# Patient Record
Sex: Female | Born: 2000 | Race: Black or African American | Hispanic: No | Marital: Single | Smoking: Never smoker
Health system: Southern US, Community
[De-identification: ages and names within clinical notes are randomized; demographics above are authoritative.]

---
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
## 2019-07-17 IMAGING — CT CT ANGIO CHEST
2 of 6 series · 19 of 36 positions shown · IV contrast (iopamidol)
Comparison: None.

CLINICAL DATA: Chest pain positive D-dimer

EXAM:
CT ANGIOGRAPHY CHEST WITH CONTRAST
TECHNIQUE: Multidetector CT imaging of the chest was performed using the
standard protocol during bolus administration of intravenous
contrast. Multiplanar CT image reconstructions and MIPs were
obtained to evaluate the vascular anatomy.
CONTRAST:  100mL 8LX1VI-ST2 IOPAMIDOL (8LX1VI-ST2) INJECTION 76%

[Series 9: pe thins · axial · 0.77mm/px · z∈[+1130,+1336]mm · 18 of 329 slices shown]
[im 17/329  lung]
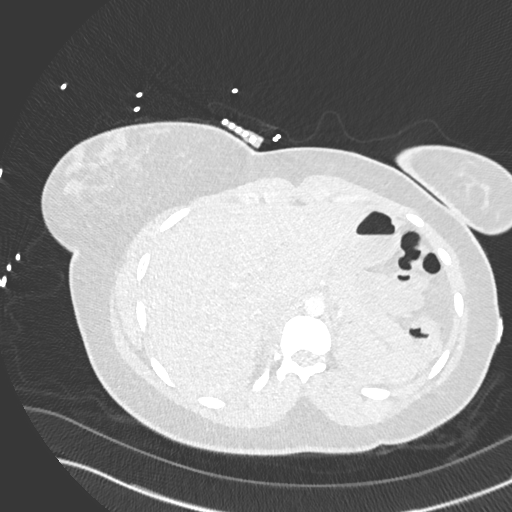
[im 33/329  mediastinal]
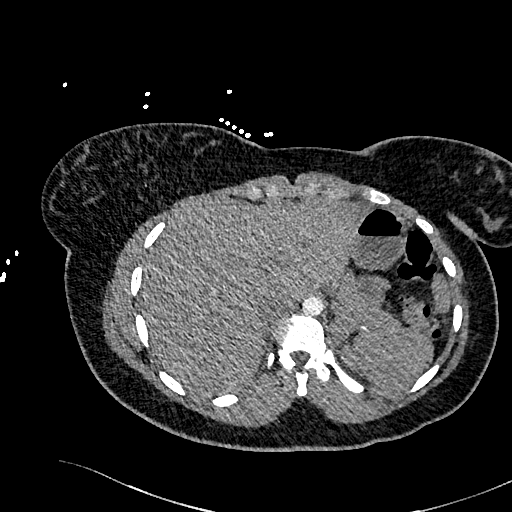
[im 50/329  lung]
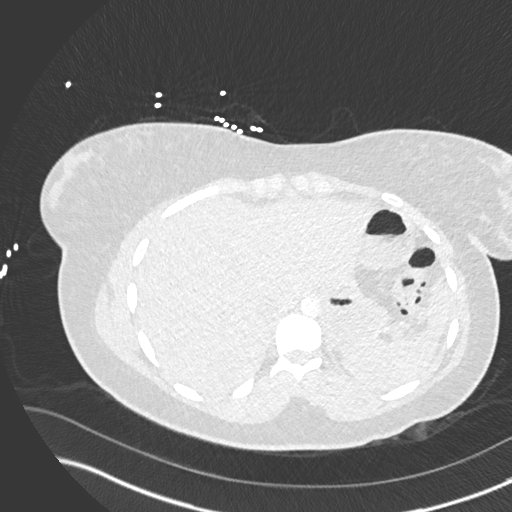
[im 66/329  mediastinal]
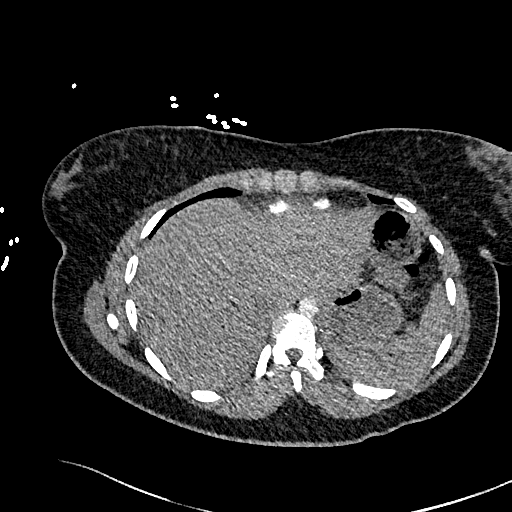
[im 83/329  lung]
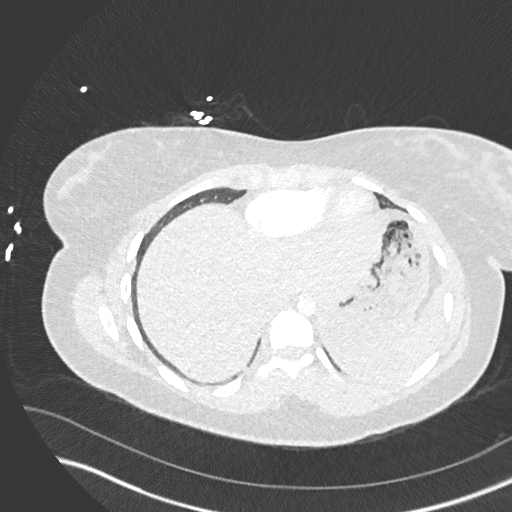
[im 99/329  mediastinal]
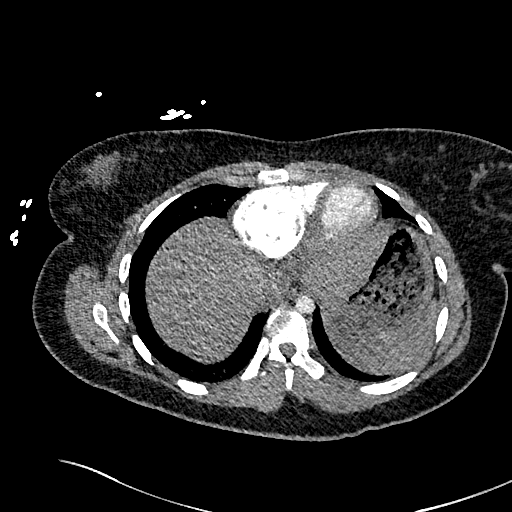
[im 115/329  lung]
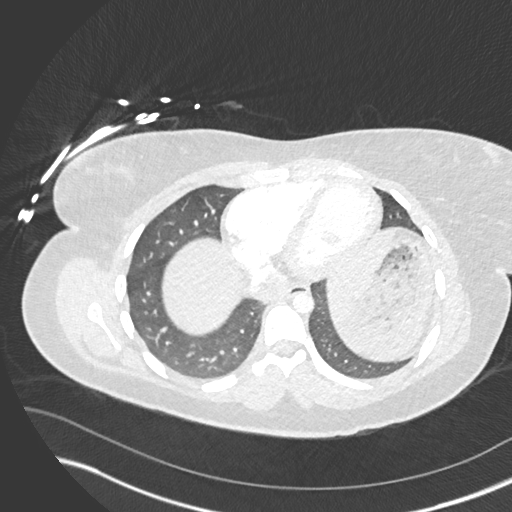
[im 132/329  mediastinal]
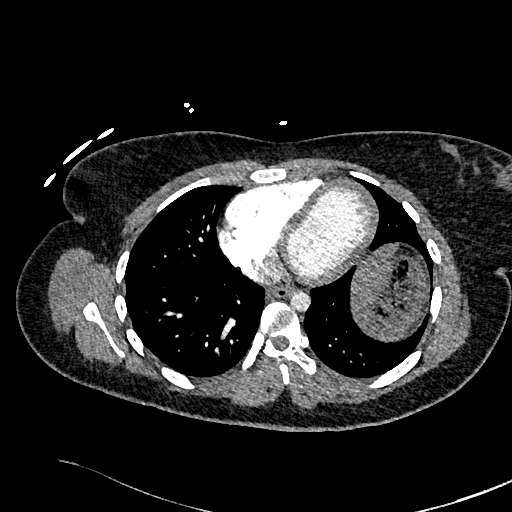
[im 148/329  lung]
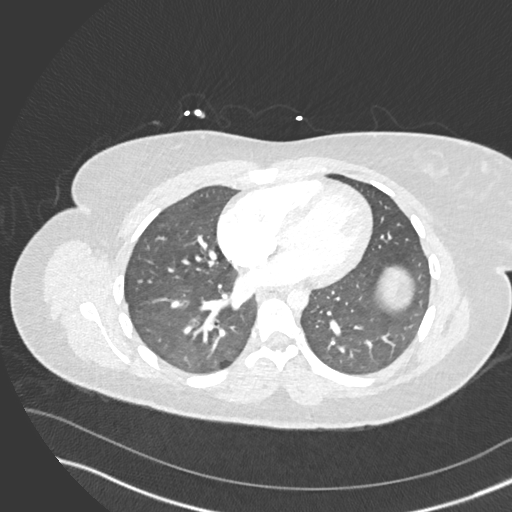
[im 181/329  mediastinal]
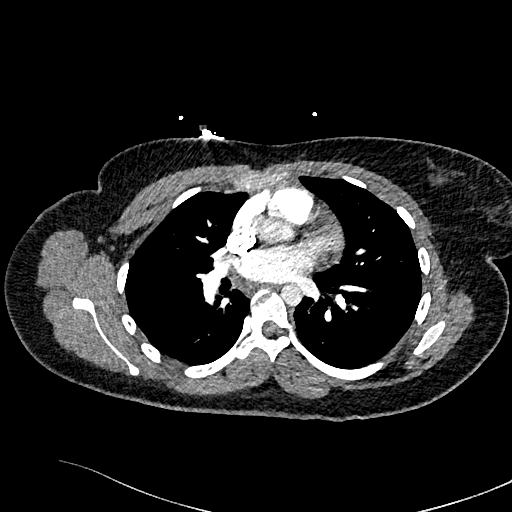
[im 197/329  lung]
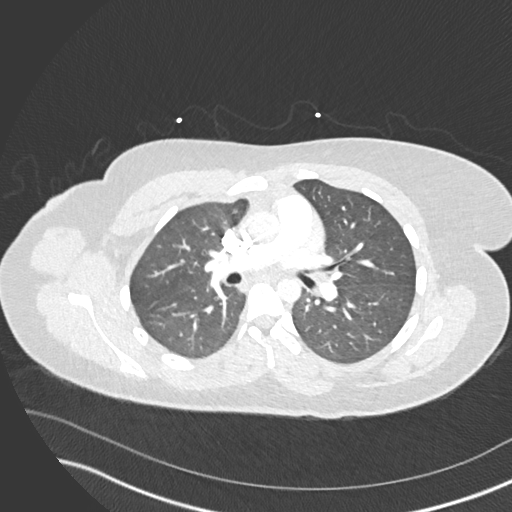
[im 214/329  mediastinal]
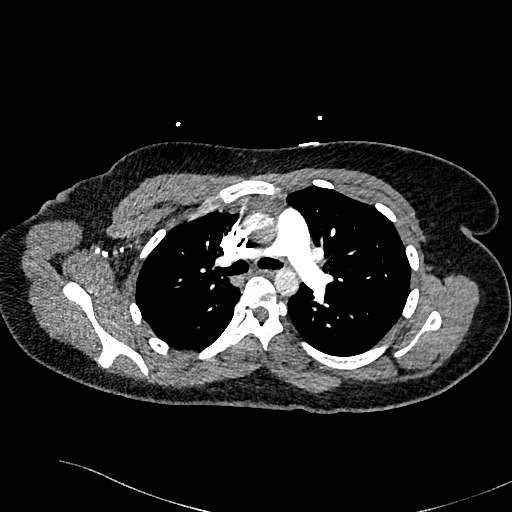
[im 230/329  lung]
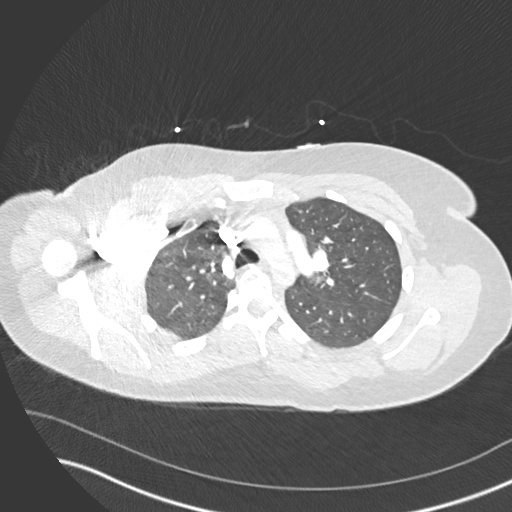
[im 246/329  mediastinal]
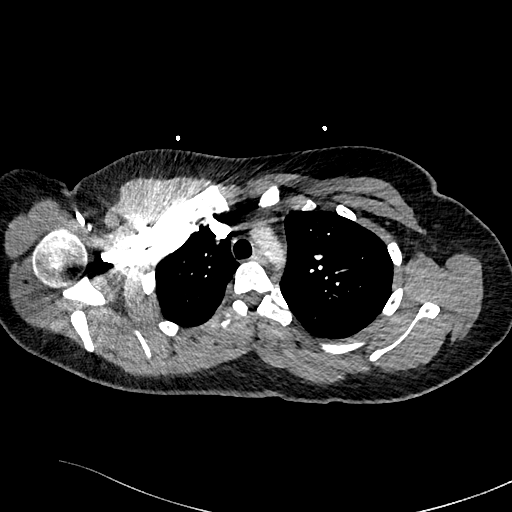
[im 263/329  lung]
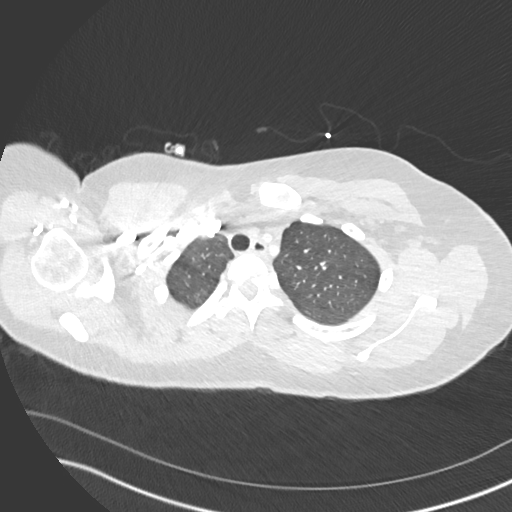
[im 279/329  mediastinal]
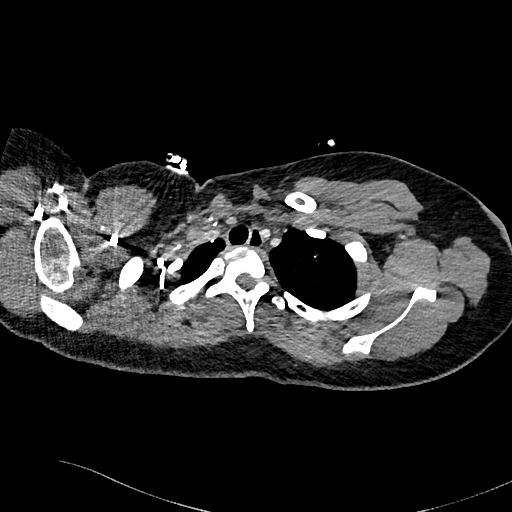
[im 296/329  lung]
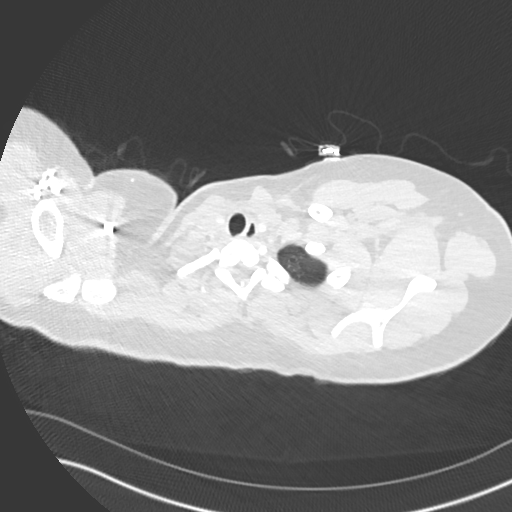
[im 312/329  mediastinal]
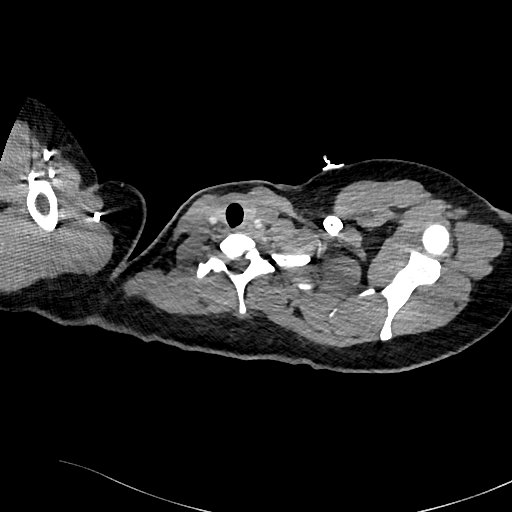

[Series 10: pe 2mm cor · coronal · 0.48mm/px · 1 of 134 slices shown]
[im 67/134  mediastinal]
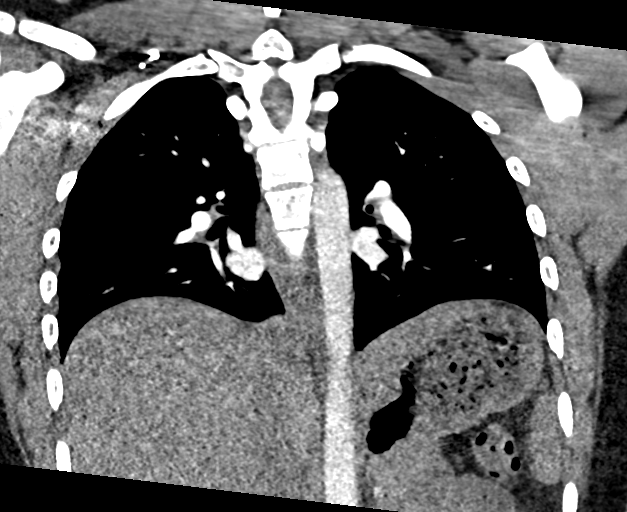

[19 of 36 positions shown; findings below may reference images not displayed]

FINDINGS: Cardiovascular: Satisfactory opacification of the pulmonary arteries
to the segmental level. No evidence of pulmonary embolism. Normal
heart size. No pericardial effusion. Nonaneurysmal aorta

Mediastinum/Nodes: No enlarged mediastinal, hilar, or axillary lymph
nodes. Thyroid gland, trachea, and esophagus demonstrate no
significant findings.

Lungs/Pleura: Lungs are clear. No pleural effusion or pneumothorax.

Upper Abdomen: No acute abnormality.

Musculoskeletal: No chest wall abnormality. No acute or significant
osseous findings.

Review of the MIP images confirms the above findings.
IMPRESSION: Negative. No CT evidence for acute pulmonary embolus or aortic
dissection. Clear lung fields.
